# Patient Record
Sex: Male | Born: 1940 | Race: White | Hispanic: No | Marital: Married | Smoking: Former smoker
Health system: Southern US, Community
[De-identification: ages and names within clinical notes are randomized; demographics above are authoritative.]

## PROBLEM LIST (undated history)

## (undated) DIAGNOSIS — R51 Headache: Secondary | ICD-10-CM

## (undated) DIAGNOSIS — I4510 Unspecified right bundle-branch block: Secondary | ICD-10-CM

## (undated) DIAGNOSIS — F419 Anxiety disorder, unspecified: Secondary | ICD-10-CM

## (undated) DIAGNOSIS — K635 Polyp of colon: Secondary | ICD-10-CM

## (undated) DIAGNOSIS — G479 Sleep disorder, unspecified: Secondary | ICD-10-CM

## (undated) DIAGNOSIS — R519 Headache, unspecified: Secondary | ICD-10-CM

## (undated) DIAGNOSIS — I10 Essential (primary) hypertension: Secondary | ICD-10-CM

## (undated) DIAGNOSIS — M1990 Unspecified osteoarthritis, unspecified site: Secondary | ICD-10-CM

## (undated) DIAGNOSIS — E785 Hyperlipidemia, unspecified: Secondary | ICD-10-CM

## (undated) DIAGNOSIS — H269 Unspecified cataract: Secondary | ICD-10-CM

## (undated) HISTORY — DX: Headache: R51

## (undated) HISTORY — DX: Headache, unspecified: R51.9

## (undated) HISTORY — DX: Polyp of colon: K63.5

## (undated) HISTORY — DX: Sleep disorder, unspecified: G47.9

## (undated) HISTORY — DX: Anxiety disorder, unspecified: F41.9

## (undated) HISTORY — DX: Unspecified cataract: H26.9

## (undated) HISTORY — DX: Hyperlipidemia, unspecified: E78.5

## (undated) HISTORY — DX: Unspecified osteoarthritis, unspecified site: M19.90

## (undated) HISTORY — PX: TONSILLECTOMY: SUR1361

## (undated) HISTORY — DX: Essential (primary) hypertension: I10

## (undated) HISTORY — DX: Unspecified right bundle-branch block: I45.10

---

## 2005-12-01 ENCOUNTER — Emergency Department: Payer: Self-pay | Admitting: Emergency Medicine

## 2005-12-01 ENCOUNTER — Other Ambulatory Visit: Payer: Self-pay

## 2005-12-02 ENCOUNTER — Ambulatory Visit: Payer: Self-pay | Admitting: Emergency Medicine

## 2005-12-10 ENCOUNTER — Ambulatory Visit: Payer: Self-pay | Admitting: Nurse Practitioner

## 2009-02-01 ENCOUNTER — Ambulatory Visit: Payer: Self-pay | Admitting: Unknown Physician Specialty

## 2010-10-09 ENCOUNTER — Observation Stay: Payer: Self-pay | Admitting: Internal Medicine

## 2010-11-08 ENCOUNTER — Emergency Department: Payer: Self-pay | Admitting: Emergency Medicine

## 2011-07-10 ENCOUNTER — Ambulatory Visit: Payer: Self-pay | Admitting: Ophthalmology

## 2011-07-22 ENCOUNTER — Ambulatory Visit: Payer: Self-pay | Admitting: Ophthalmology

## 2011-08-29 ENCOUNTER — Ambulatory Visit: Payer: Self-pay | Admitting: Ophthalmology

## 2011-09-05 ENCOUNTER — Ambulatory Visit: Payer: Self-pay | Admitting: Gastroenterology

## 2011-12-02 DIAGNOSIS — H269 Unspecified cataract: Secondary | ICD-10-CM

## 2011-12-02 HISTORY — DX: Unspecified cataract: H26.9

## 2012-12-01 DIAGNOSIS — K635 Polyp of colon: Secondary | ICD-10-CM

## 2012-12-01 HISTORY — DX: Polyp of colon: K63.5

## 2012-12-01 HISTORY — PX: COLONOSCOPY: SHX174

## 2013-10-11 ENCOUNTER — Ambulatory Visit: Payer: Self-pay | Admitting: Gastroenterology

## 2013-10-14 LAB — PATHOLOGY REPORT

## 2013-12-08 DIAGNOSIS — R85613 High grade squamous intraepithelial lesion on cytologic smear of anus (HGSIL): Secondary | ICD-10-CM | POA: Insufficient documentation

## 2014-04-06 ENCOUNTER — Ambulatory Visit: Payer: Self-pay | Admitting: Physical Medicine and Rehabilitation

## 2014-05-02 ENCOUNTER — Emergency Department: Payer: Self-pay | Admitting: Emergency Medicine

## 2014-05-02 LAB — COMPREHENSIVE METABOLIC PANEL
ALT: 35 U/L (ref 12–78)
Albumin: 2.5 g/dL — ABNORMAL LOW (ref 3.4–5.0)
Alkaline Phosphatase: 127 U/L — ABNORMAL HIGH
Anion Gap: 13 (ref 7–16)
BUN: 13 mg/dL (ref 7–18)
Bilirubin,Total: 0.4 mg/dL (ref 0.2–1.0)
CHLORIDE: 96 mmol/L — AB (ref 98–107)
CREATININE: 0.98 mg/dL (ref 0.60–1.30)
Calcium, Total: 9.5 mg/dL (ref 8.5–10.1)
Co2: 25 mmol/L (ref 21–32)
EGFR (African American): >60
EGFR (Non-African Amer.): >60
Glucose: 144 mg/dL — ABNORMAL HIGH (ref 65–99)
OSMOLALITY: 271 (ref 275–301)
Potassium: 3.9 mmol/L (ref 3.5–5.1)
SGOT(AST): 36 U/L (ref 15–37)
Sodium: 134 mmol/L — ABNORMAL LOW (ref 136–145)
Total Protein: 8.9 g/dL — ABNORMAL HIGH (ref 6.4–8.2)

## 2014-05-02 LAB — URINALYSIS, COMPLETE
Bacteria: NONE SEEN
Bilirubin,UR: NEGATIVE
GLUCOSE, UR: NEGATIVE mg/dL (ref 0–75)
NITRITE: NEGATIVE
Ph: 5 (ref 4.5–8.0)
Protein: 100
RBC,UR: 1081 /HPF (ref 0–5)
SQUAMOUS EPITHELIAL: NONE SEEN
Specific Gravity: 1.034 (ref 1.003–1.030)
WBC UR: 34314 /HPF (ref 0–5)

## 2014-05-02 LAB — CBC WITH DIFFERENTIAL/PLATELET
Basophil #: 0.1 10*3/uL (ref 0.0–0.1)
Basophil %: 0.9 %
EOS ABS: 0.1 10*3/uL (ref 0.0–0.7)
EOS PCT: 0.9 %
HCT: 41.3 % (ref 40.0–52.0)
HGB: 13.6 g/dL (ref 13.0–18.0)
LYMPHS ABS: 1 10*3/uL (ref 1.0–3.6)
LYMPHS PCT: 6.5 %
MCH: 30 pg (ref 26.0–34.0)
MCHC: 33 g/dL (ref 32.0–36.0)
MCV: 91 fL (ref 80–100)
MONOS PCT: 10.9 %
Monocyte #: 1.6 x10 3/mm — ABNORMAL HIGH (ref 0.2–1.0)
Neutrophil #: 11.9 10*3/uL — ABNORMAL HIGH (ref 1.4–6.5)
Neutrophil %: 80.8 %
Platelet: 520 10*3/uL — ABNORMAL HIGH (ref 150–440)
RBC: 4.54 10*6/uL (ref 4.40–5.90)
RDW: 14.1 % (ref 11.5–14.5)
WBC: 14.7 10*3/uL — ABNORMAL HIGH (ref 3.8–10.6)

## 2014-05-02 LAB — LIPASE, BLOOD: LIPASE: 71 U/L — AB (ref 73–393)

## 2014-05-02 LAB — CLOSTRIDIUM DIFFICILE(ARMC)

## 2014-05-04 LAB — STOOL CULTURE

## 2014-05-31 DIAGNOSIS — I4510 Unspecified right bundle-branch block: Secondary | ICD-10-CM

## 2014-05-31 HISTORY — DX: Unspecified right bundle-branch block: I45.10

## 2014-06-19 ENCOUNTER — Emergency Department: Payer: Self-pay | Admitting: Emergency Medicine

## 2014-06-19 LAB — URINALYSIS, COMPLETE
Bilirubin,UR: NEGATIVE
Glucose,UR: NEGATIVE mg/dL (ref 0–75)
Nitrite: NEGATIVE
PH: 5 (ref 4.5–8.0)
Protein: 100
Specific Gravity: 1.027 (ref 1.003–1.030)
Squamous Epithelial: NONE SEEN
WBC UR: 1121 /HPF (ref 0–5)

## 2014-06-19 LAB — CBC WITH DIFFERENTIAL/PLATELET
BASOS PCT: 1.2 %
Basophil #: 0.1 10*3/uL (ref 0.0–0.1)
Eosinophil #: 0.4 10*3/uL (ref 0.0–0.7)
Eosinophil %: 3.5 %
HCT: 42.6 % (ref 40.0–52.0)
HGB: 13.7 g/dL (ref 13.0–18.0)
LYMPHS ABS: 1.8 10*3/uL (ref 1.0–3.6)
Lymphocyte %: 15.9 %
MCH: 30.1 pg (ref 26.0–34.0)
MCHC: 32.1 g/dL (ref 32.0–36.0)
MCV: 94 fL (ref 80–100)
Monocyte #: 1.3 x10 3/mm — ABNORMAL HIGH (ref 0.2–1.0)
Monocyte %: 11.4 %
NEUTROS ABS: 7.5 10*3/uL — AB (ref 1.4–6.5)
Neutrophil %: 68 %
Platelet: 286 10*3/uL (ref 150–440)
RBC: 4.56 10*6/uL (ref 4.40–5.90)
RDW: 16.3 % — AB (ref 11.5–14.5)
WBC: 11.1 10*3/uL — ABNORMAL HIGH (ref 3.8–10.6)

## 2014-06-19 LAB — COMPREHENSIVE METABOLIC PANEL
ALBUMIN: 3.7 g/dL (ref 3.4–5.0)
ALT: 20 U/L (ref 12–78)
Alkaline Phosphatase: 122 U/L — ABNORMAL HIGH
Anion Gap: 8 (ref 7–16)
BILIRUBIN TOTAL: 0.5 mg/dL (ref 0.2–1.0)
BUN: 16 mg/dL (ref 7–18)
CHLORIDE: 108 mmol/L — AB (ref 98–107)
CO2: 23 mmol/L (ref 21–32)
CREATININE: 1.14 mg/dL (ref 0.60–1.30)
Calcium, Total: 9 mg/dL (ref 8.5–10.1)
EGFR (African American): >60
EGFR (Non-African Amer.): >60
GLUCOSE: 106 mg/dL — AB (ref 65–99)
Osmolality: 279 (ref 275–301)
Potassium: 3.8 mmol/L (ref 3.5–5.1)
SGOT(AST): 23 U/L (ref 15–37)
SODIUM: 139 mmol/L (ref 136–145)
Total Protein: 8.6 g/dL — ABNORMAL HIGH (ref 6.4–8.2)

## 2014-06-23 LAB — URINE CULTURE

## 2015-03-24 NOTE — H&P (Signed)
   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 Guy Hall is a pleasant, healthy 74 yo M who presented initially in early July with suprapubic pain, leukocytosis and CT scan concerning for diverticulitis with peridiverticular abscess and probable colovesical fistula.  Returns for follow up to ensure resolution.  Was initially doing better but has redeveloped suprapubic discomfort.  Has not limited activities due to this.  + pneumoturia.  Some burning with urination and sensation of "passing meat".  Colonoscopy in 2014 showed diverticulosis.   Past History Diverticulitis Probable colovesical fistula   Past Med/Surgical Hx:  denies:   Tonsillectomy:   ALLERGIES:  Sulfa drugs: Itching  Other- Explain in Comments Line: Unknown  Family and Social History:  Family History Negative   Social History negative tobacco, negative ETOH   + [place]   Review of Systems:  Subjective/Chief Complaint Suprapubic pain, pneumoturia   Fever/Chills No   Cough No   Sputum No   Abdominal Pain Yes  Mild suprapubic discomfort   Diarrhea No   Constipation Yes   Nausea/Vomiting No   SOB/DOE No   Chest Pain No   Dysuria Yes   Tolerating PT No   Tolerating Diet Yes   Physical Exam:  GEN well developed, well nourished, no acute distress   HEENT pale conjunctivae, PERRL, hearing intact to voice   RESP normal resp effort  clear BS  no use of accessory muscles   CARD regular rate  no murmur  no thrills   ABD positive tenderness  soft  rigid  distended  normal BS   SKIN normal to palpation, No rashes, No ulcers   NEURO cranial nerves intact, negative rigidity, negative tremor   PSYCH A+O to time, place, person, good insight    Assessment/Admission Diagnosis Mr. 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   Alternative is primary bladder tumor eroding into colon but likely wouldnt have pericolonic abscess as was obvious on prior CT scan.  Some pneumoturia and dysuria but otherwise doing well.  Amount of air in bladder on CT is decreased compared to prior CT, as is pericolonic abscess.   Plan Plan PO abx x 2 weeks and follow up as outpatient next week.  Will likely obtain urology consult and will likely require elective colectomy with prep in near future.   Electronic Signatures: Jarvis NewcomerLundquist, Vegas Coffin A (MD)  (Signed 20-Jul-15 21:36)  Authored: CHIEF COMPLAINT and HISTORY, PAST MEDICAL/SURGIAL HISTORY, ALLERGIES, FAMILY AND SOCIAL HISTORY, REVIEW OF SYSTEMS, PHYSICAL EXAM, ASSESSMENT AND PLAN   Last Updated: 20-Jul-15 21:36 by Jarvis NewcomerLundquist, Teondra Newburg A (MD)

## 2015-08-15 ENCOUNTER — Encounter: Payer: Self-pay | Admitting: *Deleted

## 2015-08-29 ENCOUNTER — Ambulatory Visit (INDEPENDENT_AMBULATORY_CARE_PROVIDER_SITE_OTHER): Payer: Medicare PPO | Admitting: General Surgery

## 2015-08-29 ENCOUNTER — Encounter: Payer: Self-pay | Admitting: General Surgery

## 2015-08-29 VITALS — BP 132/72 | HR 78 | Resp 14 | Ht 64.0 in | Wt 172.0 lb

## 2015-08-29 DIAGNOSIS — N400 Enlarged prostate without lower urinary tract symptoms: Secondary | ICD-10-CM | POA: Diagnosis not present

## 2015-08-29 DIAGNOSIS — N321 Vesicointestinal fistula: Secondary | ICD-10-CM

## 2015-08-29 LAB — POC HEMOCCULT BLD/STL (OFFICE/1-CARD/DIAGNOSTIC): Fecal Occult Blood, POC: NEGATIVE

## 2015-08-29 MED ORDER — TAMSULOSIN HCL 0.4 MG PO CAPS: 0.4000 mg | ORAL_CAPSULE | Freq: Every day | ORAL | Status: AC

## 2015-08-29 NOTE — Progress Notes (Signed)
Patient ID: Guy Phoenix Sr., male   DOB: 09-Sep-1941, 74 y.o.   MRN: [id number]  Chief Complaint  Patient presents with  . Other    Fistula    HPI Guy VANDEZANDE Sr. is a 74 y.o. male.  Here today for evaluation of a possible colon fistula. He states that when he goes to the bathroom to void his also passes air as well.  He states he has noticied this for about 6 months. He states his bowels move daily and are regular although perhaps slightly more sluggish than in the past. He states when it first started he had lower abdominal pain but the pain has subsided. He finished his Cipro a few days ago.  He does admit to getting up to void at night 3-6 times a night.  He did go to the ED in June and July 2015 for lower abdominal pain.   HPI  Past Medical History  Diagnosis Date  . Cataract 2013    bilaternal  . Hyperlipidemia   . Hypertension   . Osteoarthritis   . Anxiety   . Sleep disorder   . Colon polyp 2014    benign  . Head ache   . Right bundle branch block July 2015    ECG at [facility]    Past Surgical History  Procedure Laterality Date  . Colonoscopy  2014  . Tonsillectomy      No family history on file.  Social History Social History  Substance Use Topics  . Smoking status: Former Smoker -- 1.00 packs/day for 10 years    Types: Cigarettes  . Smokeless tobacco: Never Used  . Alcohol Use: No    Allergies  Allergen Reactions  . Erythromycin Nausea And Vomiting  . Sulfamethoxazole-Trimethoprim Itching    Current Outpatient Prescriptions  Medication Sig Dispense Refill  . amLODipine (NORVASC) 5 MG tablet TAKE ONE TABLET BY MOUTH EVERY DAY    . atorvastatin (LIPITOR) 80 MG tablet Take by mouth.    . gabapentin (NEURONTIN) 300 MG capsule Take by mouth.    . mirtazapine (REMERON) 15 MG tablet Take by mouth.    . topiramate (TOPAMAX) 50 MG tablet Take by mouth.    . tamsulosin (FLOMAX) 0.4 MG CAPS capsule Take 1 capsule (0.4 mg total) by mouth daily. 30 capsule 0    No current facility-administered medications for this visit.    Review of Systems Review of Systems  Constitutional: Negative.  Negative for unexpected weight change.  HENT: Negative.   Eyes: Negative.   Respiratory: Negative.   Cardiovascular: Negative.   Gastrointestinal: Negative.  Negative for diarrhea, constipation, anal bleeding and rectal pain.  Endocrine: Negative.   Genitourinary: Positive for urgency and frequency. Negative for dysuria.  Allergic/Immunologic: Negative.   Neurological: Negative.   Hematological: Negative.   Psychiatric/Behavioral: Negative.     Blood pressure 132/72, pulse 78, resp. rate 14, height  (1.626 m), weight 172 lb (78.019 kg).  Physical Exam Physical Exam  Constitutional: He is oriented to person, place, and time. He appears well-developed and well-nourished.  HENT:  Mouth/Throat: Oropharynx is clear and moist.  Eyes: Conjunctivae are normal. No scleral icterus.  Neck: Neck supple.  Cardiovascular: Normal rate, regular rhythm and normal heart sounds.   Pulses:      Posterior tibial pulses are 2+ on the right side, and 2+ on the left side.  No lower leg edema.  Pulmonary/Chest: Effort normal and breath sounds normal.  Abdominal: Soft. Normal appearance and  bowel sounds are normal. There is no tenderness.  Genitourinary: Prostate normal. Rectal exam shows no fissure, no mass and no tenderness. Guaiac negative stool. Prostate is not tender.  Lymphadenopathy:    He has no cervical adenopathy.  Neurological: He is alert and oriented to person, place, and time.  Skin: Skin is warm and dry.  Psychiatric: His behavior is normal.    Data Reviewed Colonoscopy dated 10/11/2013 completed by Guy Hall, M.D. was notable for a papillary lesion on the anus, biopsied it of which showed a low-grade squamous intraepithelial lesion, AIN 1.   Multiple small mouth diverticuli were noted throughout the colon.  The patient was reportedly  evaluated by Guy Hall, M.D. in January 2015. These records have been requested.   CT scan of the abdomen and pelvis completed to the emergency room on 05/02/2014 for lower abdominal pain showed fatty infiltration of the liver. Extraluminal air adjacent to the colon consistent with an abscess with an air-fluid level measuring 4.4 cm with inflammatory changes extending from aunt. Aspect of the sigmoid to the superior aspect of the urinary bladder. Significant wall thickening of the urinary bladder is noted. Air within the lumen his bladder is appreciated suggestive of fistula. Review of the emergency record showed a white blood cell count of 14,000, urinalysis showing 1000 red cells and 10,000 white cells. Sent home with levofloxacin and Pyridium.  CT scan of the abdomen and pelvis dated 06/20/2015 through the ED showed previous inflammatory changes significantly improved. Still with abnormal soft tissue density extending to the superior wall of the bladder. Small bilateral inguinal hernias with fat. Diffuse fatty infiltration of the liver. Findings consistent with colo-vesicle fistula. White blood cell count at that visit had fallen to 11,100. Urinalysis showed 100 RBCs, 1100 WBCs prior part field. Culture showed Klebsiella and Enterobacter, both greater than 100,000 colonies. Sensitive to third-generation Cephalosporium's and Cipro.  Laboratory studies of 08/15/2015 showed normal electrolytes with a BUN of 16, creatinine 0.9, estimate GFR of 83. Hemoglobin A1c mildly elevated at 6.3. Normal liver function studies with the exception of an outflow phosphatase at 105, upper limit of normal 104. Ciprofloxacin, 500 mg twice a day prescribed for 1 week at that time.  Phone conversation with Vella Kohler, M.D., 08/30/2015.  Assessment    Long-standing colo-vesicle fistula.  Chronic cystitis secondary fistula.  Possible prostatitis resulting in frequent nocturnal voiding. (The patient does not  volunteer frequent voiding during the day)     Plan    The patient will require surgical resection of the sigmoid colon to alleviate his symptoms. Prior surgical intervention a lot of be sure that there is no outflow obstruction, and he will be placed on Flomax daily. We'll assess his nocturia in 6 weeks. We'll anticipate a formal sigmoid resection after that.     Start Flomax to help with the voiding. Follow up in 6 weeks.  PCP:  Colbert Coyer 08/30/2015, 9:27 AM

## 2015-08-29 NOTE — Patient Instructions (Addendum)
The patient is aware to call back for any questions or concerns. Tamsulosin capsules What is this medicine? TAMSULOSIN (tam SOO loe sin) is used to treat enlargement of the prostate gland in men, a condition called benign prostatic hyperplasia or BPH. It is not for use in women. It works by relaxing muscles in the prostate and bladder neck. This improves urine flow and reduces BPH symptoms. This medicine may be used for other purposes; ask your health care provider or pharmacist if you have questions. COMMON BRAND NAME(S): Flomax What should I tell my health care provider before I take this medicine? They need to know if you have any of the following conditions: -advanced kidney disease -advanced liver disease -low blood pressure -prostate cancer -an unusual or allergic reaction to tamsulosin, sulfa drugs, other medicines, foods, dyes, or preservatives -pregnant or trying to get pregnant -breast-feeding How should I use this medicine? Take this medicine by mouth about 30 minutes after the same meal every day. Follow the directions on the prescription label. Swallow the capsules whole with a glass of water. Do not crush, chew, or open capsules. Do not take your medicine more often than directed. Do not stop taking your medicine unless your doctor tells you to. Talk to your pediatrician regarding the use of this medicine in children. Special care may be needed. Overdosage: If you think you have taken too much of this medicine contact a poison control center or emergency room at once. NOTE: This medicine is only for you. Do not share this medicine with others. What if I miss a dose? If you miss a dose, take it as soon as you can. If it is almost time for your next dose, take only that dose. Do not take double or extra doses. If you stop taking your medicine for several days or more, ask your doctor or health care professional what dose you should start back on. What may interact with this  medicine? -cimetidine -fluoxetine -ketoconazole -medicines for erectile disfunction like sildenafil, tadalafil, vardenafil -medicines for high blood pressure -other alpha-blockers like alfuzosin, doxazosin, phentolamine, phenoxybenzamine, prazosin, terazosin -warfarin This list may not describe all possible interactions. Give your health care provider a list of all the medicines, herbs, non-prescription drugs, or dietary supplements you use. Also tell them if you smoke, drink alcohol, or use illegal drugs. Some items may interact with your medicine. What should I watch for while using this medicine? Visit your doctor or health care professional for regular check ups. You will need lab work done before you start this medicine and regularly while you are taking it. Check your blood pressure as directed. Ask your health care professional what your blood pressure should be, and when you should contact him or her. This medicine may make you feel dizzy or lightheaded. This is more likely to happen after the first dose, after an increase in dose, or during hot weather or exercise. Drinking alcohol and taking some medicines can make this worse. Do not drive, use machinery, or do anything that needs mental alertness until you know how this medicine affects you. Do not sit or stand up quickly. If you begin to feel dizzy, sit down until you feel better. These effects can decrease once your body adjusts to the medicine. Contact your doctor or health care professional right away if you have an erection that lasts longer than 4 hours or if it becomes painful. This may be a sign of a serious problem and must be treated right  away to prevent permanent damage. If you are thinking of having cataract surgery, tell your eye surgeon that you have taken this medicine. What side effects may I notice from receiving this medicine? Side effects that you should report to your doctor or health care professional as soon as  possible: -allergic reactions like skin rash or itching, hives, swelling of the lips, mouth, tongue, or throat -breathing problems -change in vision -feeling faint or lightheaded -irregular heartbeat -prolonged or painful erection -weakness Side effects that usually do not require medical attention (report to your doctor or health care professional if they continue or are bothersome): -back pain -change in sex drive or performance -constipation, nausea or vomiting -cough -drowsy -runny or stuffy nose -trouble sleeping This list may not describe all possible side effects. Call your doctor for medical advice about side effects. You may report side effects to FDA at 1-800-FDA-1088. Where should I keep my medicine? Keep out of the reach of children. Store at room temperature between 15 and 30 degrees C (59 and 86 degrees F). Throw away any unused medicine after the expiration date. NOTE: This sheet is a summary. It may not cover all possible information. If you have questions about this medicine, talk to your doctor, pharmacist, or health care provider.  2015, Elsevier/Gold Standard. (2012-11-17 14:11:34)

## 2015-08-30 ENCOUNTER — Telehealth: Payer: Self-pay | Admitting: *Deleted

## 2015-08-30 ENCOUNTER — Encounter: Payer: Self-pay | Admitting: General Surgery

## 2015-08-30 DIAGNOSIS — N400 Enlarged prostate without lower urinary tract symptoms: Secondary | ICD-10-CM | POA: Insufficient documentation

## 2015-08-30 DIAGNOSIS — N321 Vesicointestinal fistula: Secondary | ICD-10-CM | POA: Insufficient documentation

## 2015-08-30 NOTE — Telephone Encounter (Signed)
Office contacted and they will fax over information.

## 2015-08-30 NOTE — Telephone Encounter (Signed)
-----   Message from Earline Mayotte, MD sent at 08/30/2015  9:31 AM EDT ----- Please contact Dr. Michaelle Copas office and see if you can get a copy of his evaluation of this patient for anal polyps and any operative procedure or additional pathology. This would've been in January 2015. Thank you

## 2015-09-05 NOTE — Telephone Encounter (Signed)
Called the office again today since records have not come through.

## 2015-09-10 ENCOUNTER — Encounter: Payer: Self-pay | Admitting: General Surgery

## 2015-09-10 NOTE — Progress Notes (Signed)
Records were obtained from the patient's 12/07/2013 excision of polypoid lesions from the anal canal. 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 His Place extended to the edge of the biopsy. The patient was sent a letter on 12/14/2013 from Dr. Michaelle Copas office requesting that he arrange follow-up to review pathology results. No indication that the patient ever contacted that physician's office for follow-up.  Weight at the time of the patient's 11/28/2013 evaluation by Dr. Katrinka Blazing was 195 pounds. The patient's weight at the time of his recent office visit was 172 pounds.  The patient has clinical and radiologic evidence of a fistula between the colon and bladder.  January 2015 biopsy results showed evidence of high-grade squamous intraepithelial dysplasia. (Right anterior anal canal).  The patient will need to have a careful anoscopy at his follow-up prior to sigmoid resection.

## 2015-10-09 ENCOUNTER — Encounter: Payer: Self-pay | Admitting: General Surgery

## 2015-10-09 ENCOUNTER — Ambulatory Visit (INDEPENDENT_AMBULATORY_CARE_PROVIDER_SITE_OTHER): Payer: Medicare PPO | Admitting: General Surgery

## 2015-10-09 VITALS — BP 132/62 | HR 82 | Resp 12 | Ht 64.0 in | Wt 173.0 lb

## 2015-10-09 DIAGNOSIS — R198 Other specified symptoms and signs involving the digestive system and abdomen: Secondary | ICD-10-CM | POA: Diagnosis not present

## 2015-10-09 DIAGNOSIS — K6289 Other specified diseases of anus and rectum: Secondary | ICD-10-CM

## 2015-10-09 DIAGNOSIS — N321 Vesicointestinal fistula: Secondary | ICD-10-CM | POA: Diagnosis not present

## 2015-10-09 DIAGNOSIS — R85613 High grade squamous intraepithelial lesion on cytologic smear of anus (HGSIL): Secondary | ICD-10-CM | POA: Diagnosis not present

## 2015-10-09 LAB — POC HEMOCCULT BLD/STL (OFFICE/1-CARD/DIAGNOSTIC): FECAL OCCULT BLD: NEGATIVE

## 2015-10-09 NOTE — Progress Notes (Signed)
Patient ID: Guy Phoenix Sr., male   DOB: 09/16/1941, 74 y.o.   MRN: [id number]  Chief Complaint  Patient presents with  . Follow-up    HPI Guy XU Sr. is a 74 y.o. male.  Here today for follow up from colovesical fistula. He states he does not feel the Flomax has helped his voiding, he still voids 3-6 times a night. He does have a little lower abdominal pain that is constant and some days is worse than others. There was no  improvement in the episodes of nocturia with the initiation of Flomax. He had noticed rectal bleeding back in the summer but non since.  Bowels move daily and occasionally 2-3 times a day.   HPI  Past Medical History  Diagnosis Date  . Cataract 2013    bilaternal  . Hyperlipidemia   . Hypertension   . Osteoarthritis   . Anxiety   . Sleep disorder   . Colon polyp 2014    benign  . Head ache   . Right bundle branch block July 2015    ECG at [facility]    Past Surgical History  Procedure Laterality Date  . Colonoscopy  2014  . Tonsillectomy      No family history on file.  Social History Social History  Substance Use Topics  . Smoking status: Former Smoker -- 1.00 packs/day for 10 years    Types: Cigarettes  . Smokeless tobacco: Never Used  . Alcohol Use: No    Allergies  Allergen Reactions  . Erythromycin Nausea And Vomiting  . Sulfamethoxazole-Trimethoprim Itching    Current Outpatient Prescriptions  Medication Sig Dispense Refill  . amLODipine (NORVASC) 5 MG tablet TAKE ONE TABLET BY MOUTH EVERY DAY    . atorvastatin (LIPITOR) 80 MG tablet Take by mouth.    . gabapentin (NEURONTIN) 300 MG capsule Take by mouth.    . mirtazapine (REMERON) 15 MG tablet Take by mouth.    . tamsulosin (FLOMAX) 0.4 MG CAPS capsule Take 1 capsule (0.4 mg total) by mouth daily. 30 capsule 0  . topiramate (TOPAMAX) 50 MG tablet Take by mouth.     No current facility-administered medications for this visit.    Review of Systems Review of Systems   Constitutional: Negative.   Respiratory: Negative.   Cardiovascular: Negative.   Gastrointestinal: Negative for diarrhea, constipation and blood in stool.    Blood pressure 132/62, pulse 82, resp. rate 12, height  (1.626 m), weight 173 lb (78.472 kg).  The patient's weight is stable from his exam 6 weeks ago.  Physical Exam Physical Exam  Constitutional: He is oriented to person, place, and time. He appears well-developed and well-nourished.  HENT:  Mouth/Throat: Oropharynx is clear and moist.  Eyes: Conjunctivae are normal. No scleral icterus.  Neck: Neck supple.  Cardiovascular: Normal rate, regular rhythm and normal heart sounds.   Pulses:      Dorsalis pedis pulses are 2+ on the right side, and 2+ on the left side.  No lower leg edema.  Pulmonary/Chest: Effort normal and breath sounds normal.  Abdominal: Soft. Bowel sounds are normal.  Genitourinary: Rectum normal and prostate normal. Rectal exam shows no external hemorrhoid, no fissure and anal tone normal. Guaiac negative stool.     3 x 7 mm rectal nodule at 7 o'clock   Lymphadenopathy:    He has no cervical adenopathy.  Neurological: He is alert and oriented to person, place, and time.  Skin: Skin is warm and dry.  Psychiatric: His behavior is normal.    Data Reviewed See previous chart review of January 2015 anal nodule excision. High-grade squamous metaplasia.  Assessment    Colovesical fistula.  High-grade squamous metaplasia of the anus.    Plan    Surgical resection of the sigmoid colon with closure of the fistula and short-term (one-week) bladder drainage were recommended for management of his frequent nocturia and ongoing diverticular symptoms. The patient declined surgical intervention at this time.  The patient has not appreciated any improvement in his nocturia symptoms and he was encouraged to discontinue the Flomax.  Excision of the perianal lesion noted on today's exam under local  anesthesia as an office procedure was strongly recommended based on the past history of high-grade dysplasia was in January 2015.    PCP:  Colbert CoyerAnderson, Marshall   Emiley Digiacomo W 10/09/2015, 9:18 PM

## 2015-10-09 NOTE — Patient Instructions (Signed)
The patient is aware to call back for any questions or concerns.  

## 2015-10-23 NOTE — Progress Notes (Signed)
Hopefully you will get this, not sure why it was sent to me

## 2015-10-24 ENCOUNTER — Encounter: Payer: Self-pay | Admitting: General Surgery

## 2015-10-24 ENCOUNTER — Ambulatory Visit (INDEPENDENT_AMBULATORY_CARE_PROVIDER_SITE_OTHER): Payer: Medicare PPO | Admitting: General Surgery

## 2015-10-24 VITALS — BP 130/70 | HR 80 | Resp 14 | Ht 64.0 in | Wt 174.0 lb

## 2015-10-24 DIAGNOSIS — K629 Disease of anus and rectum, unspecified: Secondary | ICD-10-CM

## 2015-10-24 NOTE — Progress Notes (Signed)
Patient ID: Guy Phoenixavid L Beagle Sr., male   DOB: [birth year], 74 y.o.   MRN: [id number]  Chief Complaint  Patient presents with  . Procedure    HPI Guy PhoenixDavid L Fichera Sr. is a 74 y.o. male.  Here today for excision anal lesion.   HPI  Past Medical History  Diagnosis Date  . Cataract 2013    bilaternal  . Hyperlipidemia   . Hypertension   . Osteoarthritis   . Anxiety   . Sleep disorder   . Colon polyp 2014    benign  . Head ache   . Right bundle branch block July 2015    [facility]    Past Surgical History  Procedure Laterality Date  . Colonoscopy  2014  . Tonsillectomy      No family history on file.  Social History Social History  Substance Use Topics  . Smoking status: Former Smoker -- 1.00 packs/day for 10 years    Types: Cigarettes  . Smokeless tobacco: Never Used  . Alcohol Use: No    Allergies  Allergen Reactions  . Erythromycin Nausea And Vomiting  . Sulfamethoxazole-Trimethoprim Itching    Current Outpatient Prescriptions  Medication Sig Dispense Refill  . amLODipine (NORVASC) 5 MG tablet TAKE ONE TABLET BY MOUTH EVERY DAY    . atorvastatin (LIPITOR) 80 MG tablet Take by mouth.    . gabapentin (NEURONTIN) 300 MG capsule Take by mouth.    . mirtazapine (REMERON) 15 MG tablet Take by mouth.    . tamsulosin (FLOMAX) 0.4 MG CAPS capsule Take 1 capsule (0.4 mg total) by mouth daily. 30 capsule 0  . topiramate (TOPAMAX) 50 MG tablet Take by mouth.     No current facility-administered medications for this visit.    Review of Systems Review of Systems  Constitutional: Negative.   Respiratory: Negative.   Cardiovascular: Negative.     Blood pressure 130/70, pulse 80, resp. rate 14, height 5\' 4"  (1.626 m), weight 174 lb (78.926 kg).  Physical Exam Physical Exam  Genitourinary:          Assessment    Anal lesion, past history high-grade squamous intraepithelial lesion.    Plan    The procedure was reviewed and the patient was amenable to  proceed. The area was prepped with Betadine solution and 6 mL of 0.5% Xylocaine with 0.25% Marcaine with 1-200,000 of epinephrine was utilized well tolerated. Repeat prep was undertaken. The area was excised through elliptical incision. The wound was closed with a single 3-0 Vicryls suture. Scant bleeding was noted. The patient was observed for 10 minutes. Wound care was reviewed. He'll be contacted when pathology is available.      PCP:  Colbert CoyerAnderson, Marshall  Byrnett, Jeffrey W 10/25/2015, 6:54 PM

## 2015-10-24 NOTE — Patient Instructions (Signed)
Keep are clean

## 2015-10-25 DIAGNOSIS — K629 Disease of anus and rectum, unspecified: Secondary | ICD-10-CM | POA: Insufficient documentation

## 2015-10-30 ENCOUNTER — Telehealth: Payer: Self-pay | Admitting: *Deleted

## 2015-10-30 NOTE — Telephone Encounter (Signed)
Notified patient as instructed, patient pleased. He states he noticed bleeding after a Bm once but it has cleared up. Discussed follow-up appointments, patient agrees

## 2015-10-30 NOTE — Telephone Encounter (Signed)
-----   Message from Earline MayotteJeffrey W Byrnett, MD sent at 10/29/2015  4:52 PM EST ----- Please notify the patient that the lesion removed on his recent visit was a simple skin cyst. Nothing like he had in January 2015. He should notify us if he has  any difficulty with healing. ----- Message -----    From: [facility]    Sent: 10/29/2015   9:41 AM      To: Earline MayotteJeffrey W Byrnett, MD

## 2016-03-20 ENCOUNTER — Emergency Department: Payer: Medicare PPO

## 2016-03-20 ENCOUNTER — Encounter: Payer: Self-pay | Admitting: Emergency Medicine

## 2016-03-20 ENCOUNTER — Emergency Department
Admission: EM | Admit: 2016-03-20 | Discharge: 2016-03-20 | Disposition: A | Discharge status: Home / Self Care | Payer: Medicare PPO | Attending: Emergency Medicine | Admitting: Emergency Medicine

## 2016-03-20 DIAGNOSIS — I10 Essential (primary) hypertension: Secondary | ICD-10-CM | POA: Insufficient documentation

## 2016-03-20 DIAGNOSIS — M1990 Unspecified osteoarthritis, unspecified site: Secondary | ICD-10-CM | POA: Insufficient documentation

## 2016-03-20 DIAGNOSIS — L03116 Cellulitis of left lower limb: Secondary | ICD-10-CM

## 2016-03-20 DIAGNOSIS — E785 Hyperlipidemia, unspecified: Secondary | ICD-10-CM | POA: Insufficient documentation

## 2016-03-20 DIAGNOSIS — Z79899 Other long term (current) drug therapy: Secondary | ICD-10-CM | POA: Insufficient documentation

## 2016-03-20 DIAGNOSIS — M79605 Pain in left leg: Secondary | ICD-10-CM | POA: Diagnosis present

## 2016-03-20 DIAGNOSIS — Z87891 Personal history of nicotine dependence: Secondary | ICD-10-CM | POA: Insufficient documentation

## 2016-03-20 DIAGNOSIS — Z8679 Personal history of other diseases of the circulatory system: Secondary | ICD-10-CM | POA: Diagnosis not present

## 2016-03-20 MED ORDER — CLINDAMYCIN HCL 300 MG PO CAPS: 300.0000 mg | ORAL_CAPSULE | Freq: Three times a day (TID) | ORAL | Status: AC

## 2016-03-20 MED ORDER — TRAMADOL HCL 50 MG PO TABS: 50.0000 mg | ORAL_TABLET | Freq: Four times a day (QID) | ORAL | Status: AC | PRN

## 2016-03-20 NOTE — ED Notes (Signed)
Pt presents to ED with left leg pain for approximately four days. Pt denies injury. Pt reports left knee pain that radiates to foot.

## 2016-03-20 NOTE — Discharge Instructions (Signed)

## 2016-03-20 NOTE — ED Notes (Signed)
Pt presents to ED today with complaints of LLE pain x 3 days.  Pt denies any injury.  Pt reports difficulty ambulating due to pain, reports no sleep in 3 nights due to pain.  Left ankle swollen, top of foot red, warm to touch.

## 2016-03-20 NOTE — ED Provider Notes (Signed)
CSN: [id number]     Arrival date & time 03/20/16  1115 History   First MD Initiated Contact with Patient 03/20/16 1301     Chief Complaint  Patient presents with  . Leg Pain     HPI   75 year old male who presents to the ER for evaluation of left leg pain. Pain started in the knee, that is now better but the pain has moved to his left foot. He denies injury. He states the foot has been painful and swelling for the past 2 days and he is unable to sleep. He has not been taking anything for pain.   Past Medical History  Diagnosis Date  . Cataract 2013    bilaternal  . Hyperlipidemia   . Hypertension   . Osteoarthritis   . Anxiety   . Sleep disorder   . Colon polyp 2014    benign  . Head ache   . Right bundle branch block July 2015    [facility]   Past Surgical History  Procedure Laterality Date  . Colonoscopy  2014  . Tonsillectomy     No family history on file. Social History  Substance Use Topics  . Smoking status: Former Smoker -- 1.00 packs/day for 10 years    Types: Cigarettes  . Smokeless tobacco: Never Used  . Alcohol Use: No    Review of Systems  Constitutional: Positive for fever.  Eyes: Negative.   Respiratory: Negative.   Cardiovascular: Negative for palpitations and leg swelling.  Gastrointestinal: Negative for nausea.  Musculoskeletal: Positive for gait problem and neck pain. Negative for back pain.  Skin: Positive for color change. Negative for wound.      Allergies  Erythromycin and Sulfamethoxazole-trimethoprim  Home Medications   Prior to Admission medications   Medication Sig Start Date End Date Taking? Authorizing Provider  amLODipine (NORVASC) 5 MG tablet TAKE ONE TABLET BY MOUTH EVERY DAY 05/19/14   Historical Provider, MD  atorvastatin (LIPITOR) 80 MG tablet Take by mouth. 08/15/15 08/14/16  Historical Provider, MD  clindamycin (CLEOCIN) 300 MG capsule Take 1 capsule (300 mg total) by mouth 3 (three) times daily. 03/20/16   Chinita Pesterari B  Chera Slivka, FNP  gabapentin (NEURONTIN) 300 MG capsule Take by mouth.    Historical Provider, MD  mirtazapine (REMERON) 15 MG tablet Take by mouth. 02/08/15   Historical Provider, MD  tamsulosin (FLOMAX) 0.4 MG CAPS capsule Take 1 capsule (0.4 mg total) by mouth daily. 08/29/15   Earline MayotteJeffrey W Byrnett, MD  topiramate (TOPAMAX) 50 MG tablet Take by mouth. 02/08/15 02/08/16  Historical Provider, MD  traMADol (ULTRAM) 50 MG tablet Take 1 tablet (50 mg total) by mouth every 6 (six) hours as needed. 03/20/16   Mateusz Neilan B Emmie Frakes, FNP   BP 134/75 mmHg  Pulse 96  Temp(Src) 98.5 F (36.9 C) (Oral)  Resp 18  Ht 5\' 4"  (1.626 m)  Wt 83.915 kg  BMI 31.74 kg/m2  SpO2 99% Physical Exam  Constitutional: He is oriented to person, place, and time. He appears well-developed.  HENT:  Head: Atraumatic.  Eyes: EOM are normal.  Neck: Normal range of motion.  Musculoskeletal: He exhibits edema and tenderness.       Left foot: There is decreased range of motion.       Feet:  Neurological: He is alert and oriented to person, place, and time.  Skin: Skin is warm and dry. Rash noted. There is erythema.  Psychiatric: He has a normal mood and affect.  His behavior is normal. Judgment and thought content normal.  Nursing note and vitals reviewed.   ED Course  Procedures (including critical care time) Labs Review Labs Reviewed - No data to display  Imaging Review Dg Foot Complete Left  03/20/2016  CLINICAL DATA:  Pain and swelling along the medial aspect of the foot for 1 month. Worsening pain over the past 3 days. EXAM: LEFT FOOT - COMPLETE 3+ VIEW COMPARISON:  None. FINDINGS: The joint spaces are maintained. No acute bony findings or significant degenerative changes. No destructive bone lesions or abnormal soft tissue calcifications. Calcaneal spurring changes are noted. IMPRESSION: No acute bony findings or significant degenerative changes. Electronically Signed   By: Rudie Meyer M.D.   On: 03/20/2016 14:06   I have  personally reviewed and evaluated these images and lab results as part of my medical decision-making.   EKG Interpretation None      MDM   Final diagnoses:  Cellulitis of foot without toes, left    The patient was advised that the erythema and edema appears to be cellulitis. He was instructed to take the clindamycin as prescribed and until finished. He was also given a prescription for tramadol. He was instructed to elevate the extremity as often as possible through the day. He was instructed to follow-up with his primary care provider next week for a recheck. He was instructed to return to the emergency department for symptoms that change or worsen if he is unable schedule an appointment.    Chinita Pester, FNP 03/20/16 1531  Sharyn Creamer, MD 03/20/16 [phone]

## 2016-07-01 IMAGING — CT CT ABD-PELV W/ CM
2 of 5 series · 16 of 46 positions shown, 18 images · IV contrast (isovue)
Comparison: 05/02/2014

CLINICAL DATA: Lower abdominal pain, worse with urination.
Follow-up abscess.

EXAM:
CT ABDOMEN AND PELVIS WITH CONTRAST
TECHNIQUE: Multidetector CT imaging of the abdomen and pelvis was performed
using the standard protocol following bolus administration of
intravenous contrast.
CONTRAST:  100 cc Isovue 300 IV.

[Series 2: routine abd pel with · axial · 0.76mm/px · z∈[-356,+64]mm · 13 of 96 slices shown, 15 images]
[im 6/96  soft-tissue]
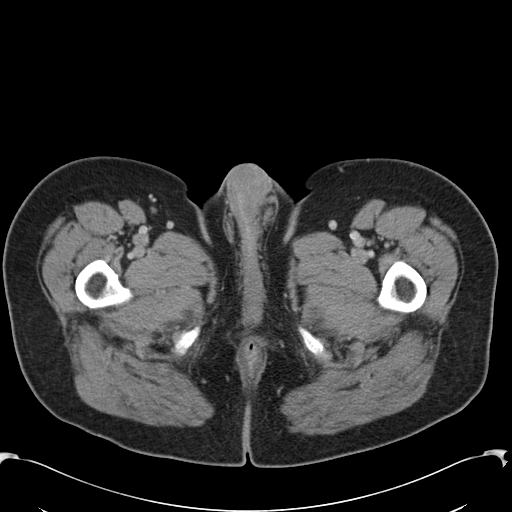
[im 6/96  bone]
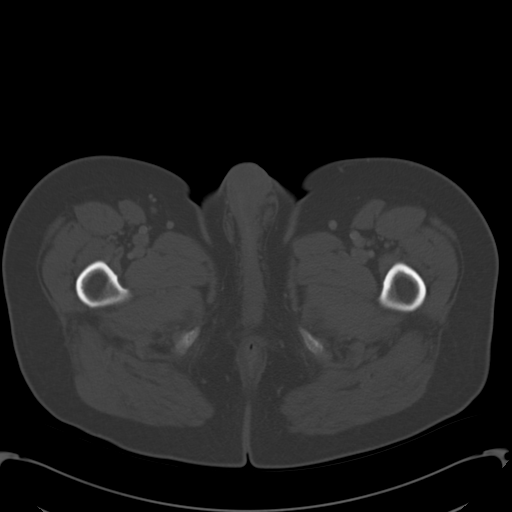
[im 11/96  soft-tissue]
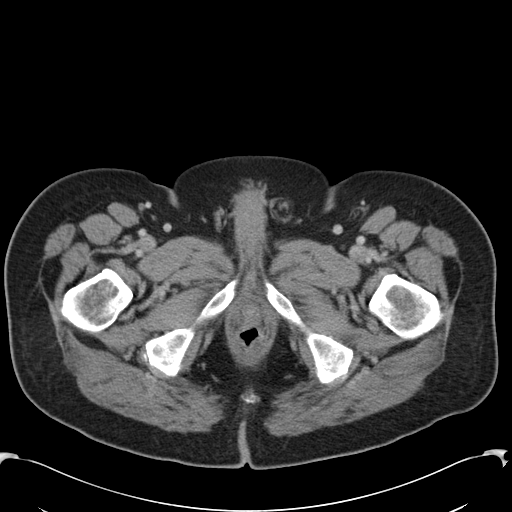
[im 22/96  soft-tissue]
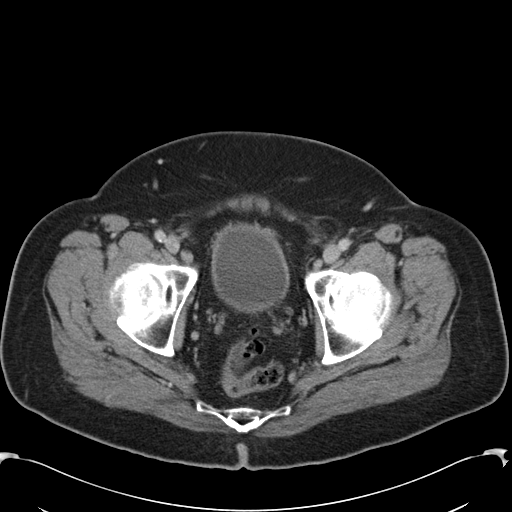
[im 27/96  soft-tissue]
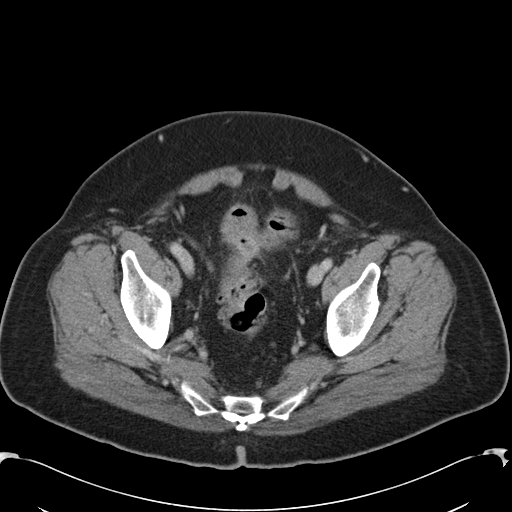
[im 32/96  soft-tissue]
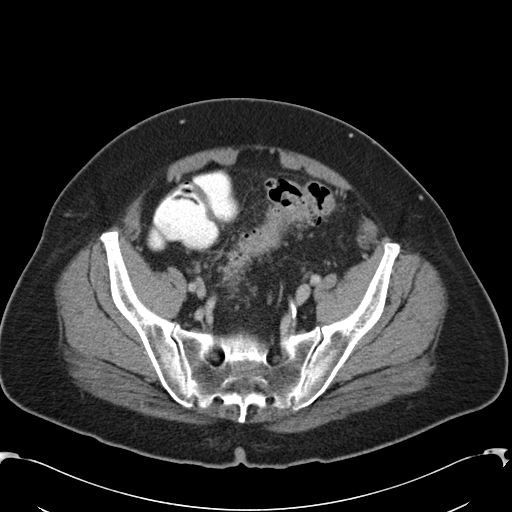
[im 43/96  soft-tissue]
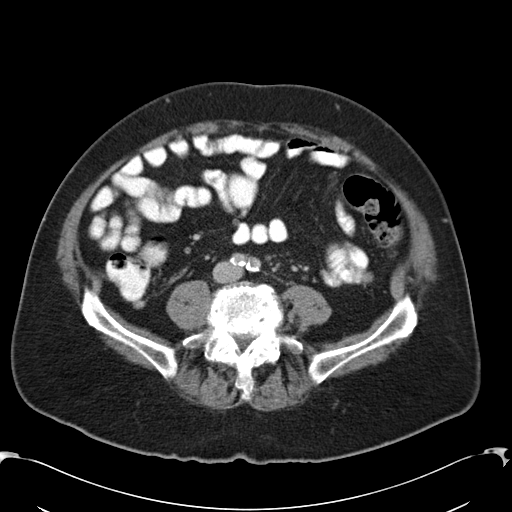
[im 48/96  soft-tissue]
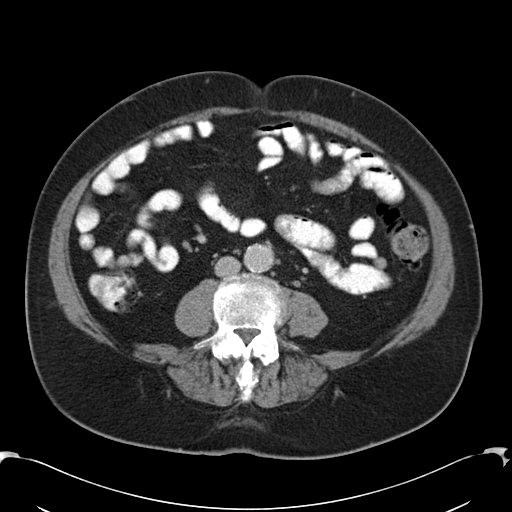
[im 53/96  soft-tissue]
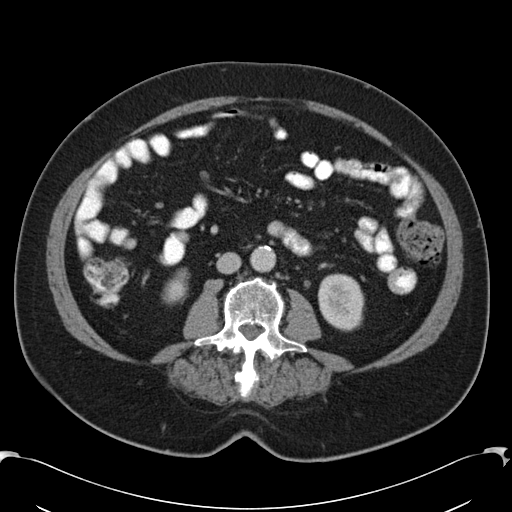
[im 64/96  soft-tissue]
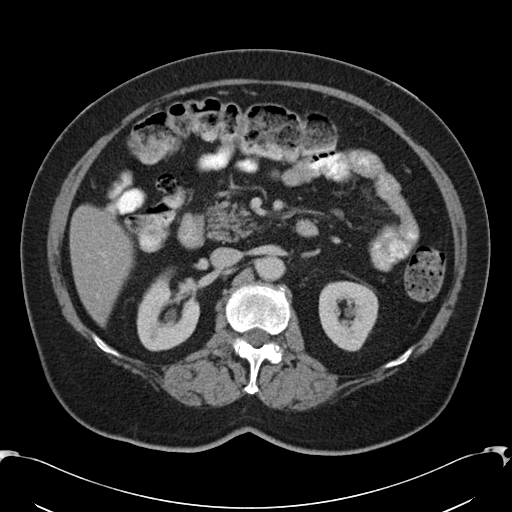
[im 64/96  bone]
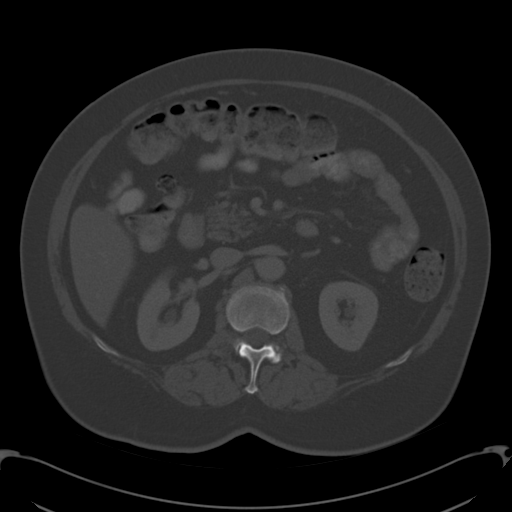
[im 69/96  soft-tissue]
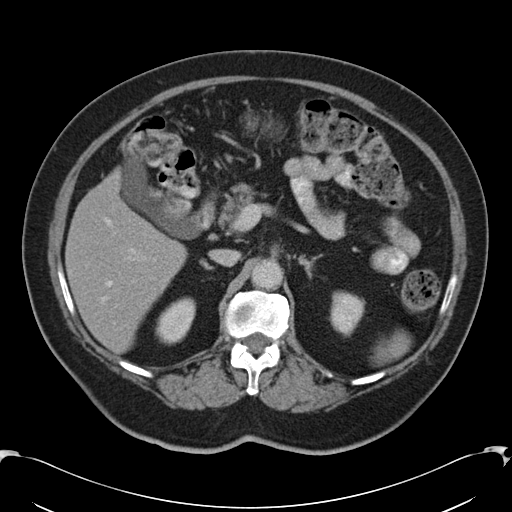
[im 74/96  soft-tissue]
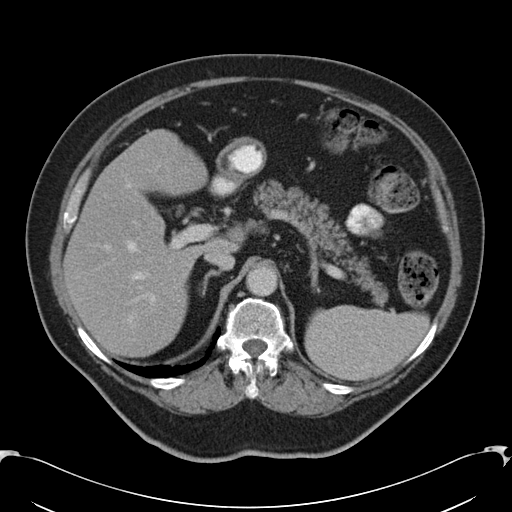
[im 85/96  soft-tissue]
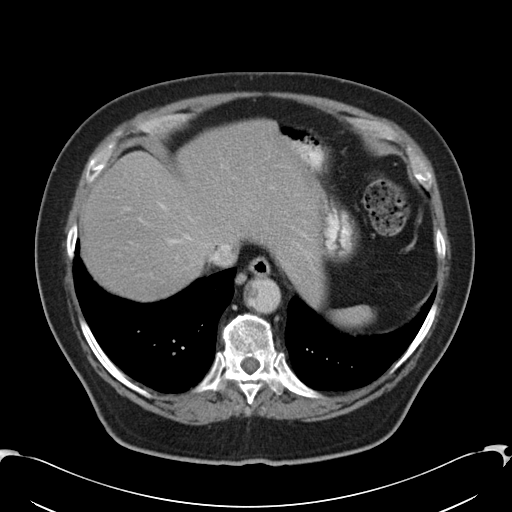
[im 90/96  soft-tissue]
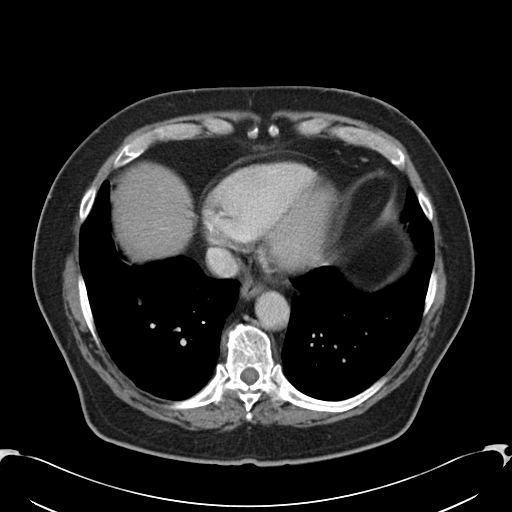

[Series 5: cor routine abd pel with · coronal · 0.72mm/px · 3 of 140 slices shown]
[im 47/140  soft-tissue]
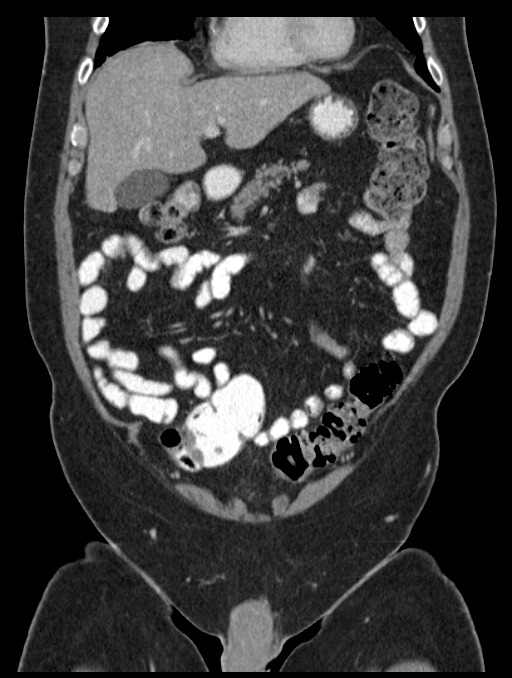
[im 62/140  soft-tissue]
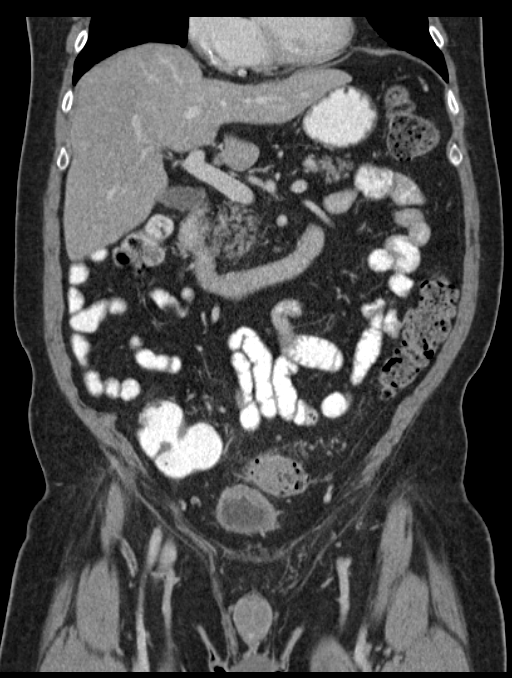
[im 78/140  soft-tissue]
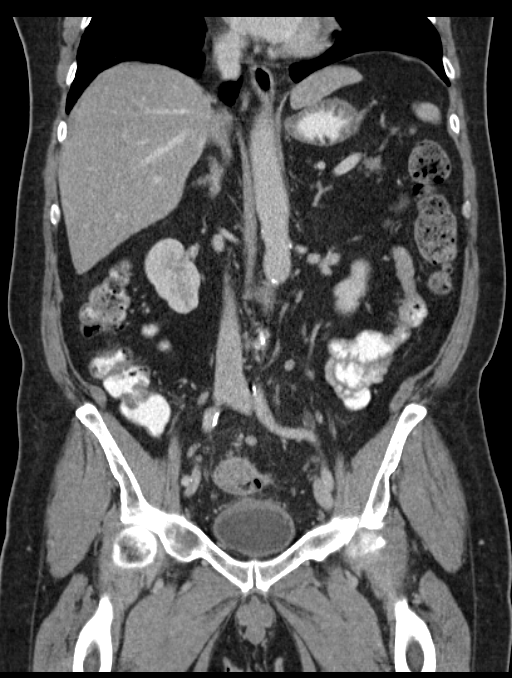

[16 of 46 positions shown; findings below may reference images not displayed]

FINDINGS: Lung bases are clear.  No effusions.  Heart is normal size.

Diffuse fatty infiltration of the liver without focal abnormality.
Gallbladder, spleen, pancreas, adrenals and kidneys are
unremarkable.

Descending colonic and sigmoid diverticulosis. Previous inflammatory
change and extraluminal gas noted on prior CT have improved in the
region of the sigmoid colon. However, continued abnormal soft tissue
extends to the superior bladder where there is bladder wall
thickening on along the superior aspect of the bladder. Small amount
of gas within the bladder lumen. Findings remain suspicious for
colovesical fistula.

No free air or free fluid. Small bowel is decompressed as is the
stomach. Slight focal ectasia of the distal abdominal aorta, 2.6 cm.
Diffuse calcifications.

Small bilateral inguinal hernias containing fat. No acute bony
abnormality. Degenerative changes in the lower lumbar spine.
IMPRESSION: Left colonic diverticulosis. Improving inflammatory change around
the sigmoid colon. Despite this, abnormal soft tissue and
extraluminal gas continues to extend to a thickened superior bladder
wall with gas noted within the bladder lumen. Findings remain
concerning for colovesical fistula.

## 2018-04-02 IMAGING — CR DG FOOT COMPLETE 3+V*L*
3 series · 3 of 3 positions shown · non-contrast
Comparison: None.

CLINICAL DATA: Pain and swelling along the medial aspect of the
foot for 1 month. Worsening pain over the past 3 days.

EXAM:
LEFT FOOT - COMPLETE 3+ VIEW

[foot ap]
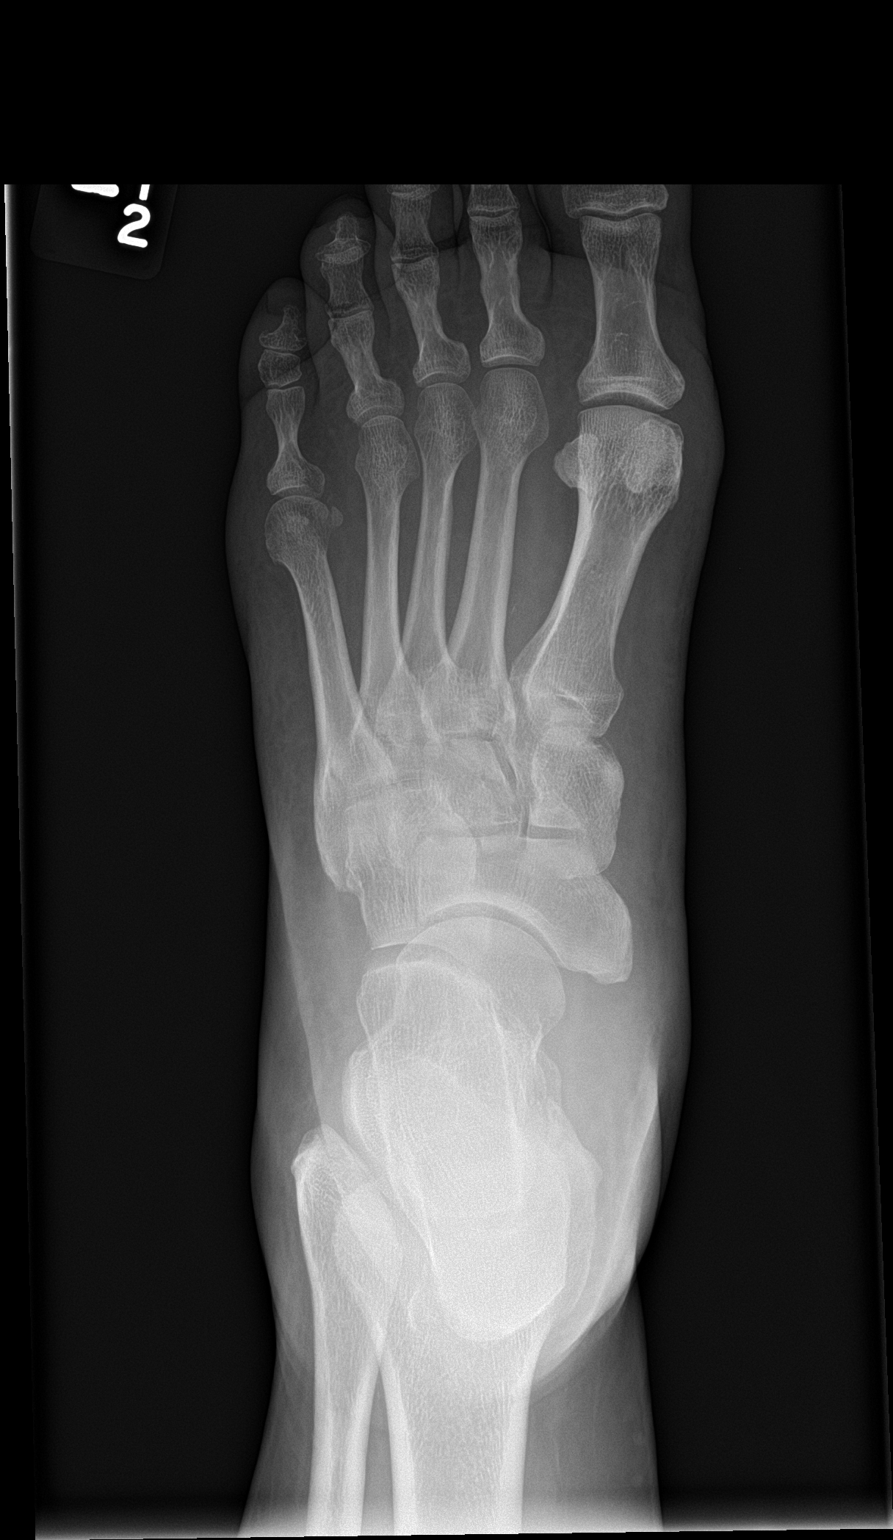

[foot obl]
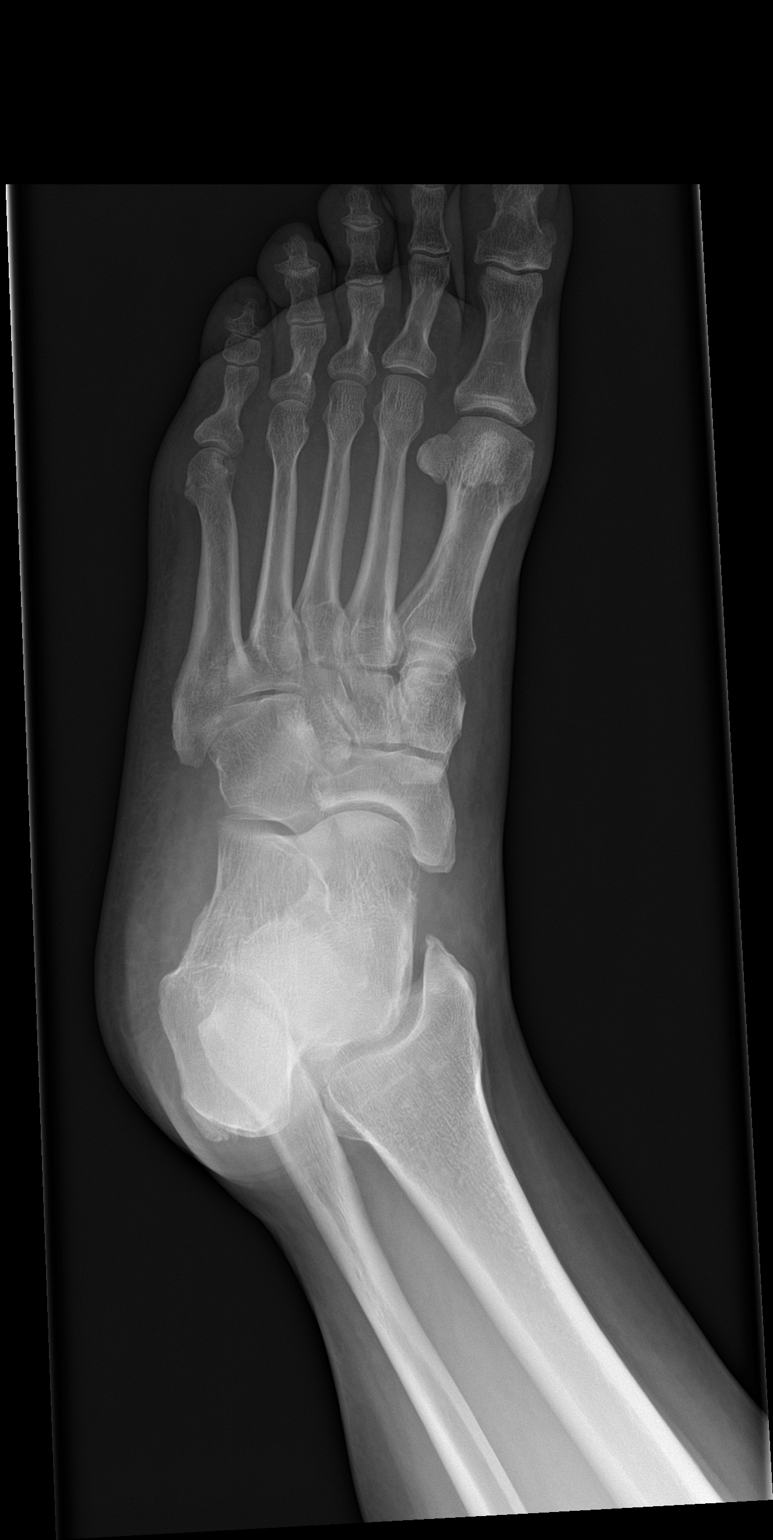

[foot lat]
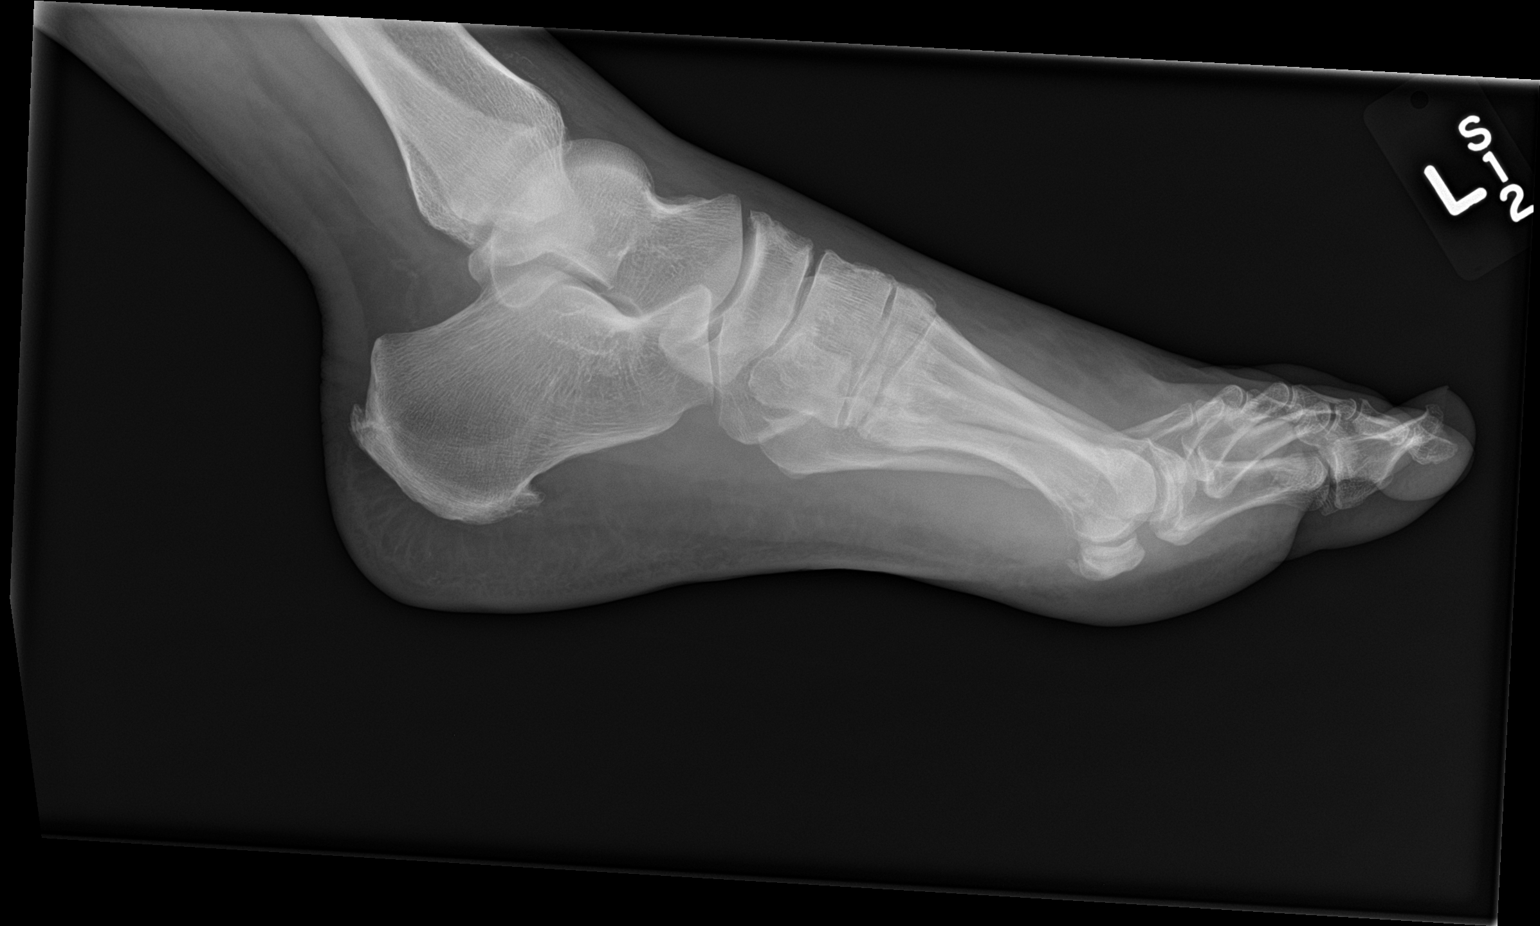

[3 of 3 positions shown; findings below may reference images not displayed]

FINDINGS: The joint spaces are maintained. No acute bony findings or
significant degenerative changes. No destructive bone lesions or
abnormal soft tissue calcifications. Calcaneal spurring changes are
noted.
IMPRESSION: No acute bony findings or significant degenerative changes.

## 2019-12-21 ENCOUNTER — Ambulatory Visit: Payer: Self-pay | Admitting: Urology
